# Patient Record
Sex: Male | Born: 1995 | Race: Black or African American | Hispanic: No | Marital: Single | Smoking: Never smoker
Health system: Southern US, Community
[De-identification: ages and names within clinical notes are randomized; demographics above are authoritative.]

---
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
## 2015-01-16 ENCOUNTER — Ambulatory Visit
Admission: EM | Admit: 2015-01-16 | Discharge: 2015-01-16 | Disposition: A | Discharge status: Home / Self Care | Payer: 59 | Attending: Family Medicine | Admitting: Family Medicine

## 2015-01-16 DIAGNOSIS — Z043 Encounter for examination and observation following other accident: Secondary | ICD-10-CM | POA: Diagnosis not present

## 2015-01-16 DIAGNOSIS — Z0000 Encounter for general adult medical examination without abnormal findings: Secondary | ICD-10-CM
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
## 2019-05-12 ENCOUNTER — Other Ambulatory Visit: Payer: Self-pay

## 2019-05-12 ENCOUNTER — Ambulatory Visit: Payer: Self-pay | Attending: Internal Medicine

## 2019-05-12 DIAGNOSIS — U071 COVID-19: Secondary | ICD-10-CM | POA: Insufficient documentation

## 2019-05-12 DIAGNOSIS — Z20822 Contact with and (suspected) exposure to covid-19: Secondary | ICD-10-CM

## 2019-05-13 LAB — NOVEL CORONAVIRUS, NAA: SARS-CoV-2, NAA: DETECTED — AB
